# Patient Record
Sex: Female | Born: 1945 | Race: White | Hispanic: No | Marital: Married | Smoking: Current every day smoker
Health system: Southern US, Community
[De-identification: ages and names within clinical notes are randomized; demographics above are authoritative.]

## PROBLEM LIST (undated history)

## (undated) DIAGNOSIS — Z9109 Other allergy status, other than to drugs and biological substances: Secondary | ICD-10-CM

## (undated) HISTORY — PX: RETINAL DETACHMENT SURGERY: SHX105

## (undated) HISTORY — PX: OVARY SURGERY: SHX727

---

## 2004-03-23 ENCOUNTER — Ambulatory Visit: Payer: Self-pay | Admitting: Internal Medicine

## 2004-05-11 ENCOUNTER — Ambulatory Visit: Payer: Self-pay | Admitting: Internal Medicine

## 2006-08-04 ENCOUNTER — Ambulatory Visit: Payer: Self-pay | Admitting: Internal Medicine

## 2006-09-06 ENCOUNTER — Ambulatory Visit: Payer: Self-pay | Admitting: Urology

## 2006-10-28 ENCOUNTER — Ambulatory Visit: Payer: Self-pay | Admitting: Unknown Physician Specialty

## 2007-07-10 ENCOUNTER — Ambulatory Visit: Payer: Self-pay | Admitting: Internal Medicine

## 2007-07-11 ENCOUNTER — Ambulatory Visit: Payer: Self-pay | Admitting: Otolaryngology

## 2008-01-30 ENCOUNTER — Ambulatory Visit: Payer: Self-pay | Admitting: Ophthalmology

## 2008-03-19 ENCOUNTER — Ambulatory Visit: Payer: Self-pay | Admitting: Ophthalmology

## 2008-06-24 ENCOUNTER — Ambulatory Visit: Payer: Self-pay | Admitting: Internal Medicine

## 2008-06-27 ENCOUNTER — Ambulatory Visit: Payer: Self-pay | Admitting: Internal Medicine

## 2008-08-13 ENCOUNTER — Ambulatory Visit: Payer: Self-pay | Admitting: Unknown Physician Specialty

## 2009-10-21 ENCOUNTER — Ambulatory Visit: Payer: Self-pay | Admitting: Ophthalmology

## 2009-10-22 ENCOUNTER — Ambulatory Visit: Payer: Self-pay | Admitting: Ophthalmology

## 2010-10-02 ENCOUNTER — Ambulatory Visit: Payer: Self-pay | Admitting: Internal Medicine

## 2011-08-09 ENCOUNTER — Ambulatory Visit: Payer: Self-pay | Admitting: Sports Medicine

## 2011-10-27 ENCOUNTER — Ambulatory Visit: Payer: Self-pay | Admitting: Obstetrics and Gynecology

## 2011-10-27 DIAGNOSIS — Z01810 Encounter for preprocedural cardiovascular examination: Secondary | ICD-10-CM

## 2011-10-27 LAB — URINALYSIS, COMPLETE
Bacteria: NONE SEEN
Bilirubin,UR: NEGATIVE
Ketone: NEGATIVE
Leukocyte Esterase: NEGATIVE
RBC,UR: 4 /HPF (ref 0–5)
Specific Gravity: 1.006 (ref 1.003–1.030)
Squamous Epithelial: <1
WBC UR: NONE SEEN /HPF (ref 0–5)

## 2011-10-27 LAB — CBC
HGB: 14.7 g/dL (ref 12.0–16.0)
MCH: 32.4 pg (ref 26.0–34.0)
Platelet: 250 10*3/uL (ref 150–440)
RBC: 4.53 10*6/uL (ref 3.80–5.20)
RDW: 13.2 % (ref 11.5–14.5)
WBC: 11.8 10*3/uL — ABNORMAL HIGH (ref 3.6–11.0)

## 2011-11-02 ENCOUNTER — Ambulatory Visit: Payer: Self-pay | Admitting: Obstetrics and Gynecology

## 2011-11-05 LAB — PATHOLOGY REPORT

## 2011-11-26 ENCOUNTER — Ambulatory Visit: Payer: Self-pay | Admitting: Internal Medicine

## 2011-12-14 ENCOUNTER — Ambulatory Visit: Payer: Self-pay | Admitting: Internal Medicine

## 2013-07-05 ENCOUNTER — Ambulatory Visit: Payer: Self-pay | Admitting: Unknown Physician Specialty

## 2013-07-09 LAB — PATHOLOGY REPORT

## 2013-07-12 ENCOUNTER — Ambulatory Visit: Payer: Self-pay | Admitting: Unknown Physician Specialty

## 2014-05-07 NOTE — Op Note (Signed)
PATIENT NAME:  Hayley Burnett, Hayley Burnett MR#:  [id number] DATE OF BIRTH:  Aug 06, 1945  DATE OF PROCEDURE:  11/02/2011  PREOPERATIVE DIAGNOSIS: Right ovarian cyst.   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   PROCEDURES:  1. Operative laparoscopy. 2. Bilateral salpingo-oophorectomy.   ANESTHESIA: General.   SURGEON: Santonio Speakman A. Patton SallesWeaver-Lee, MD   ASSISTANT: Dr. Weston SettleEryn Clipp    ESTIMATED BLOOD LOSS: Minimal.   COMPLICATIONS: None.   FINDINGS: Simple-appearing cyst on right ovary containing clear fluid.   SPECIMEN: Right tube and ovary sent for frozen section; left tube and ovary and portion of right tube sent in formalin.   INDICATIONS: The patient is a 69 year old who presents for ultrasound follow-up for findings of a cyst on right ovary. Second ultrasound continued to show the cyst. CA-125 was within normal range. The patient desired surgical evaluation. Risks, benefits, indications, and alternatives of the procedure were explained and informed consent was obtained.   PROCEDURE: The patient was taken to the operating room with IV fluids running. She was prepped and draped in the usual sterile fashion in [place] stirrups. A speculum was placed inside the vagina. The anterior lip of the cervix was grasped with a single-tooth tenaculum and a Hulka tenaculum was placed for uterine manipulation. Attention was turned to the patient's abdomen where the infraumbilical region was injected with 0.5% Sensorcaine. A 5 mm incision was placed and the Veress needle was placed. The abdomen was insufflated with CO2 gas. The Veress needle was removed and the abdomen was entered under direct visualization using a 5 mm Xcel trocar. Two additional 11 mm Xcel trocars were placed in the patient's abdomen laterally. Findings were as previously stated. The uterus was elevated and the right tube and ovary were grasped. The infundibulopelvic ligament on the right was  cauterized with the Kleppinger and transected using the harmonic scalpel. The specimen was removed with a catch bag and sent for frozen section. Attention was turned to the left tube and ovary where it was grasped, however, due to the surgical area being obscured by bowel, a fourth 5 mm suprapubic port had to be placed. The bowel was retracted back. The left tube and ovary were grasped and the infundibulopelvic ligament was cauterized with Kleppinger and the left tube and ovary were transected using the Harmonic scalpel. The remainder of the left and right tube portions were removed with the Harmonic up to the level of the uterine cornu and sent to pathology in formalin. The patient tolerated the procedure well. All surgical areas were made hemostatic. Arista was placed in the left surgical area for further hemostasis. All instrumentation was removed from the patient's abdomen after the fascial closure device was used to close both 11 mm incision fascial sites. The skin of the 11 mm incisions were closed with 4-0 Vicryl and the skin of the 5 mm incisions were closed with Dermabond. The patient tolerated the procedure well. Sponge, needle, and instrument counts were correct x2. The patient was awakened from anesthesia and taken to the recovery room in stable condition.   ____________________________ Sonda PrimesLashawn A. Patton SallesWeaver-Lee, MD law:drc Burnett: 11/02/2011 12:12:18 ET T: 11/02/2011 12:34:47 ET JOB#: [id number]  cc: Flint MelterLashawn A. Patton SallesWeaver-Lee, MD, <Dictator> Janyth ContesLASHAWN A WEAVER LEE MD ELECTRONICALLY SIGNED 11/10/2011 7:46

## 2014-10-30 ENCOUNTER — Emergency Department
Admission: EM | Admit: 2014-10-30 | Discharge: 2014-10-30 | Disposition: A | Discharge status: Home / Self Care | Payer: Medicare Other | Attending: Emergency Medicine | Admitting: Emergency Medicine

## 2014-10-30 ENCOUNTER — Emergency Department: Payer: Medicare Other

## 2014-10-30 DIAGNOSIS — Z720 Tobacco use: Secondary | ICD-10-CM | POA: Diagnosis not present

## 2014-10-30 DIAGNOSIS — Z880 Allergy status to penicillin: Secondary | ICD-10-CM | POA: Insufficient documentation

## 2014-10-30 DIAGNOSIS — R0602 Shortness of breath: Secondary | ICD-10-CM | POA: Diagnosis present

## 2014-10-30 DIAGNOSIS — Z79899 Other long term (current) drug therapy: Secondary | ICD-10-CM | POA: Diagnosis not present

## 2014-10-30 DIAGNOSIS — J441 Chronic obstructive pulmonary disease with (acute) exacerbation: Secondary | ICD-10-CM | POA: Diagnosis not present

## 2014-10-30 HISTORY — DX: Other allergy status, other than to drugs and biological substances: Z91.09

## 2014-10-30 MED ORDER — IPRATROPIUM-ALBUTEROL 0.5-2.5 (3) MG/3ML IN SOLN
9.0000 mL | Freq: Once | RESPIRATORY_TRACT | Status: AC
  Administered 2014-10-30: 9 mL via RESPIRATORY_TRACT
  Filled 2014-10-30: qty 9

## 2014-10-30 MED ORDER — PREDNISONE 20 MG PO TABS: 40.0000 mg | ORAL_TABLET | Freq: Every day | ORAL | Status: AC

## 2014-10-30 MED ORDER — AZITHROMYCIN 250 MG PO TABS: ORAL_TABLET | ORAL | Status: AC

## 2014-10-30 MED ORDER — PREDNISONE 20 MG PO TABS
60.0000 mg | ORAL_TABLET | Freq: Once | ORAL | Status: AC
  Administered 2014-10-30: 60 mg via ORAL
  Filled 2014-10-30: qty 3

## 2014-10-30 NOTE — ED Notes (Signed)
Pt presents with SOB. Pt took inhaler without relief.

## 2014-10-30 NOTE — Discharge Instructions (Signed)

## 2014-10-30 NOTE — ED Notes (Addendum)
Pt advised to take antibiotic. If side effects occur, pt was given number (Liz's number) to call to get antibiotic changed.

## 2014-10-30 NOTE — ED Provider Notes (Signed)
[facility] Emergency Department Provider Note  ____________________________________________  Time seen: 8:15 AM  I have reviewed the triage vital signs and the nursing notes.   HISTORY  Chief Complaint Shortness of Breath    HPI Hayley Burnett is a 69 y.o. female with COPD who reports an exacerbation for the last 48 hours. She states she recently traveled to [place] where she was having a lot of allergies including nasal congestion and running and a nonproductive cough and developed progressively worsening wheezing. She is been using her albuterol inhaler with some relief but the symptoms recur within a few hours. No fever, no chest pain no dizziness or syncope.  No recent trauma hospitalizations or surgeries. No leg swelling or pain.   Past Medical History  Diagnosis Date  . Environmental allergies    COPD  There are no active problems to display for this patient.    Past Surgical History  Procedure Laterality Date  . Ovary surgery    . Retinal detachment surgery     Tonsillectomy Bilateral salpingo-oophorectomy  Current Outpatient Rx  Name  Route  Sig  Dispense  Refill  . albuterol (PROAIR HFA) 108 (90 BASE) MCG/ACT inhaler   Inhalation   Inhale 2 puffs into the lungs every 6 (six) hours as needed for wheezing or shortness of breath.          . fexofenadine (ALLEGRA) 180 MG tablet   Oral   Take 90 mg by mouth daily.         . fluticasone (FLONASE) 50 MCG/ACT nasal spray   Each Nare   Place 2 sprays into both nostrils daily.         . Omeprazole 20 MG TBEC   Oral   Take 20 mg by mouth at bedtime.         Marland Kitchen. azithromycin (ZITHROMAX Z-PAK) 250 MG tablet      Take 2 tablets (500 mg) on  Day 1,  followed by 1 tablet (250 mg) once daily on Days 2 through 5.   6 each   0   . predniSONE (DELTASONE) 20 MG tablet   Oral   Take 2 tablets (40 mg total) by mouth daily.   8 tablet   0      Allergies Sulfa antibiotics;  Amoxicillin-pot clavulanate; Cephalexin; Ciprofloxacin; Doxycycline; Neggram ; and Pseudoephedrine hcl No known drug allergies  No family history on file.  Social History Social History  Substance Use Topics  . Smoking status: Current Every Day Smoker -- 0.50 packs/day    Types: Cigarettes  . Smokeless tobacco: None  . Alcohol Use: No   smokes a half pack per day, no alcohol or drug use  Review of Systems  Constitutional:   No fever or chills. No weight changes Eyes:   No blurry vision or double vision.  ENT:   No sore throat. Cardiovascular:   No chest pain. Respiratory:   Positive dyspnea and nonproductive cough Gastrointestinal:   Negative for abdominal pain, vomiting and diarrhea.  No BRBPR or melena. Genitourinary:   Negative for dysuria, urinary retention, bloody urine, or difficulty urinating. Musculoskeletal:   Negative for back pain. No joint swelling or pain. Skin:   Negative for rash. Neurological:   Negative for headaches, focal weakness or numbness. Psychiatric:  No anxiety or depression.   Endocrine:  No hot/cold intolerance, changes in energy, or sleep difficulty.  10-point ROS otherwise negative.  ____________________________________________   PHYSICAL EXAM:  VITAL SIGNS: ED Triage  Vitals  Enc Vitals Group     BP 10/30/14 0814 174/72 mmHg     Pulse Rate 10/30/14 0814 89     Resp 10/30/14 0814 16     Temp --      Temp src --      SpO2 10/30/14 0814 96 %     Weight --      Height --      Head Cir --      Peak Flow --      Pain Score --      Pain Loc --      Pain Edu? --      Excl. in GC? --      Constitutional:   Alert and oriented. Well appearing and in no distress. Eyes:   No scleral icterus. No conjunctival pallor. PERRL. EOMI ENT   Head:   Normocephalic and atraumatic.   Nose:   Positive rhinorrhea. No septal hematoma   Mouth/Throat:   MMM, no pharyngeal erythema. No peritonsillar mass. No uvula shift.   Neck:   No stridor.  No SubQ emphysema. No meningismus. Hematological/Lymphatic/Immunilogical:   No cervical lymphadenopathy. Cardiovascular:   RRR. Normal and symmetric distal pulses are present in all extremities. No murmurs, rubs, or gallops. Respiratory:  Diffuse wheezing, good air entry in all lung fields, no tachypnea nor retractions, prolonged expiratory phase. Gastrointestinal:   Soft and nontender. No distention. There is no CVA tenderness.  No rebound, rigidity, or guarding. Genitourinary:   deferred Musculoskeletal:   Nontender with normal range of motion in all extremities. No joint effusions.  No lower extremity tenderness.  No edema. Neurologic:   Normal speech and language.  CN 2-10 normal. Motor grossly intact. No pronator drift.  Normal gait. No gross focal neurologic deficits are appreciated.  Skin:    Skin is warm, dry and intact. No rash noted.  No petechiae, purpura, or bullae. Psychiatric:   Mood and affect are normal. Speech and behavior are normal. Patient exhibits appropriate insight and judgment.  ____________________________________________    LABS (pertinent positives/negatives) (all labs ordered are listed, but only abnormal results are displayed) Labs Reviewed - No data to display ____________________________________________   EKG  Interpreted by me Normal sinus rhythm rate of 83, normal axis intervals QRS and ST segments and T waves  ____________________________________________    RADIOLOGY  Chest x-ray has a bronchitis no focal pneumonia  ____________________________________________   PROCEDURES   ____________________________________________   INITIAL IMPRESSION / ASSESSMENT AND PLAN / ED COURSE  Pertinent labs & imaging results that were available during my care of the patient were reviewed by me and considered in my medical decision making (see chart for details).  Patient presents with COPD exacerbation. Low suspicion for CHF DVT PE ACS or carditis.  Likely precipitated to exposure to new seasonal allergens that she is not accustomed to versus oncoming upper respiratory infection with the rhinorrhea. Steroids and DuoNeb's, check chest x-ray. Vital signs are reassuring, not hypoxic, likely candidate for outpatient therapy if symptoms are improved with treatment in the emergency department.  ----------------------------------------- 10:28 AM on 10/30/2014 -----------------------------------------  Vital signs stable, feels much much better, lung exam shows slight diffuse expiratory wheezing but normalization of expiratory phase, no accessory muscle use, normal work of breathing. We'll keep patient on steroids and started Z-Pak and have her follow up with primary care   ____________________________________________   FINAL CLINICAL IMPRESSION(S) / ED DIAGNOSES  Final diagnoses:  COPD exacerbation (HCC)  Sharman Cheek, MD 10/30/14 1029

## 2014-10-30 NOTE — ED Notes (Signed)
Pt c/o of SOB x3 days. Pt took albuterol inhaler at 3:30am and again at 6:30 am without relief. Pt states this is allergies from traveling through [place] and [place] the past week. Noticed expiratory wheezing on assessment. Vitals WDL. Pt resting in bed.

## 2016-06-28 IMAGING — CR DG CHEST 2V
2 series · 2 of 2 positions shown · non-contrast
Comparison: None.

CLINICAL DATA: Dyspnea for 1 week, increasing over 2 days. Cough.
Tobacco use.

EXAM:
CHEST  2 VIEW

[chest pa]
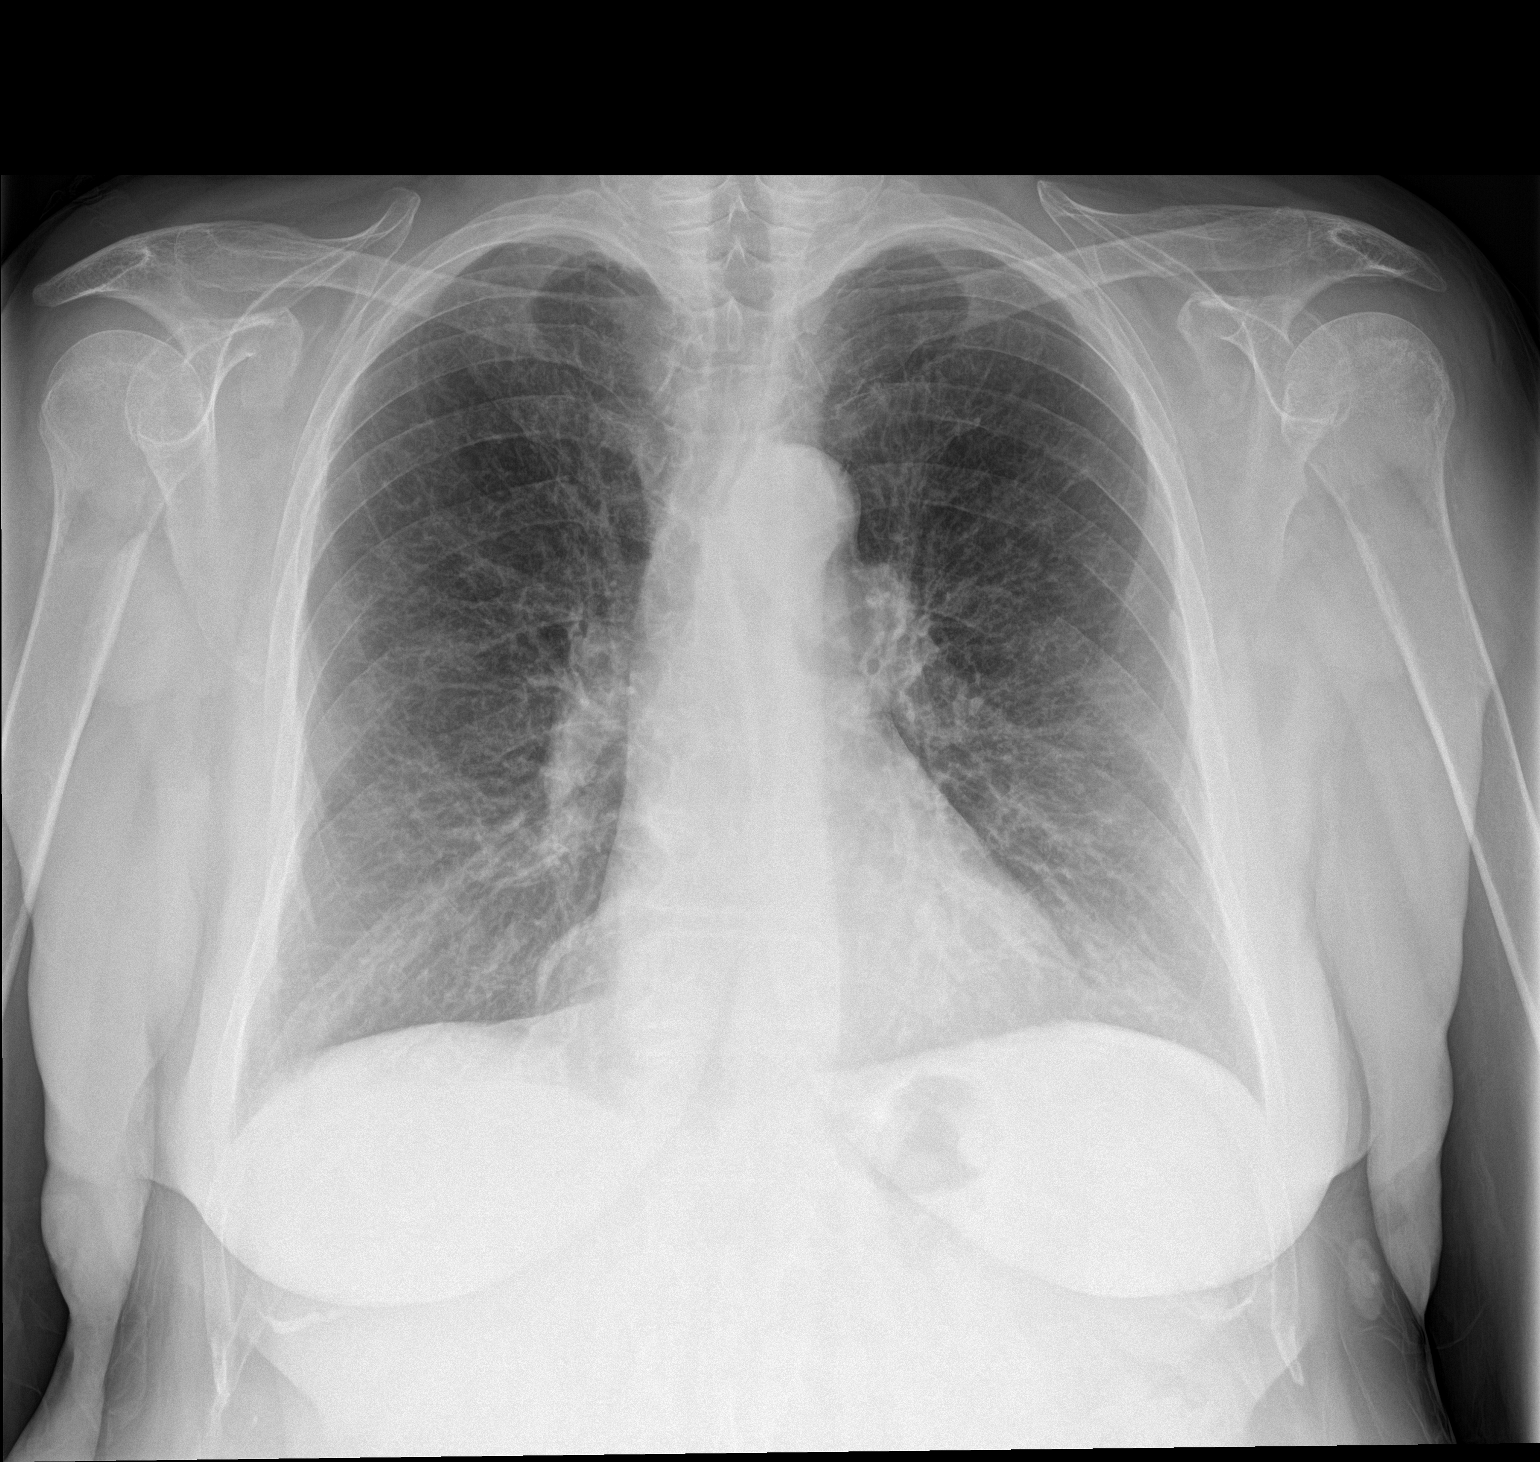

[chest lat]
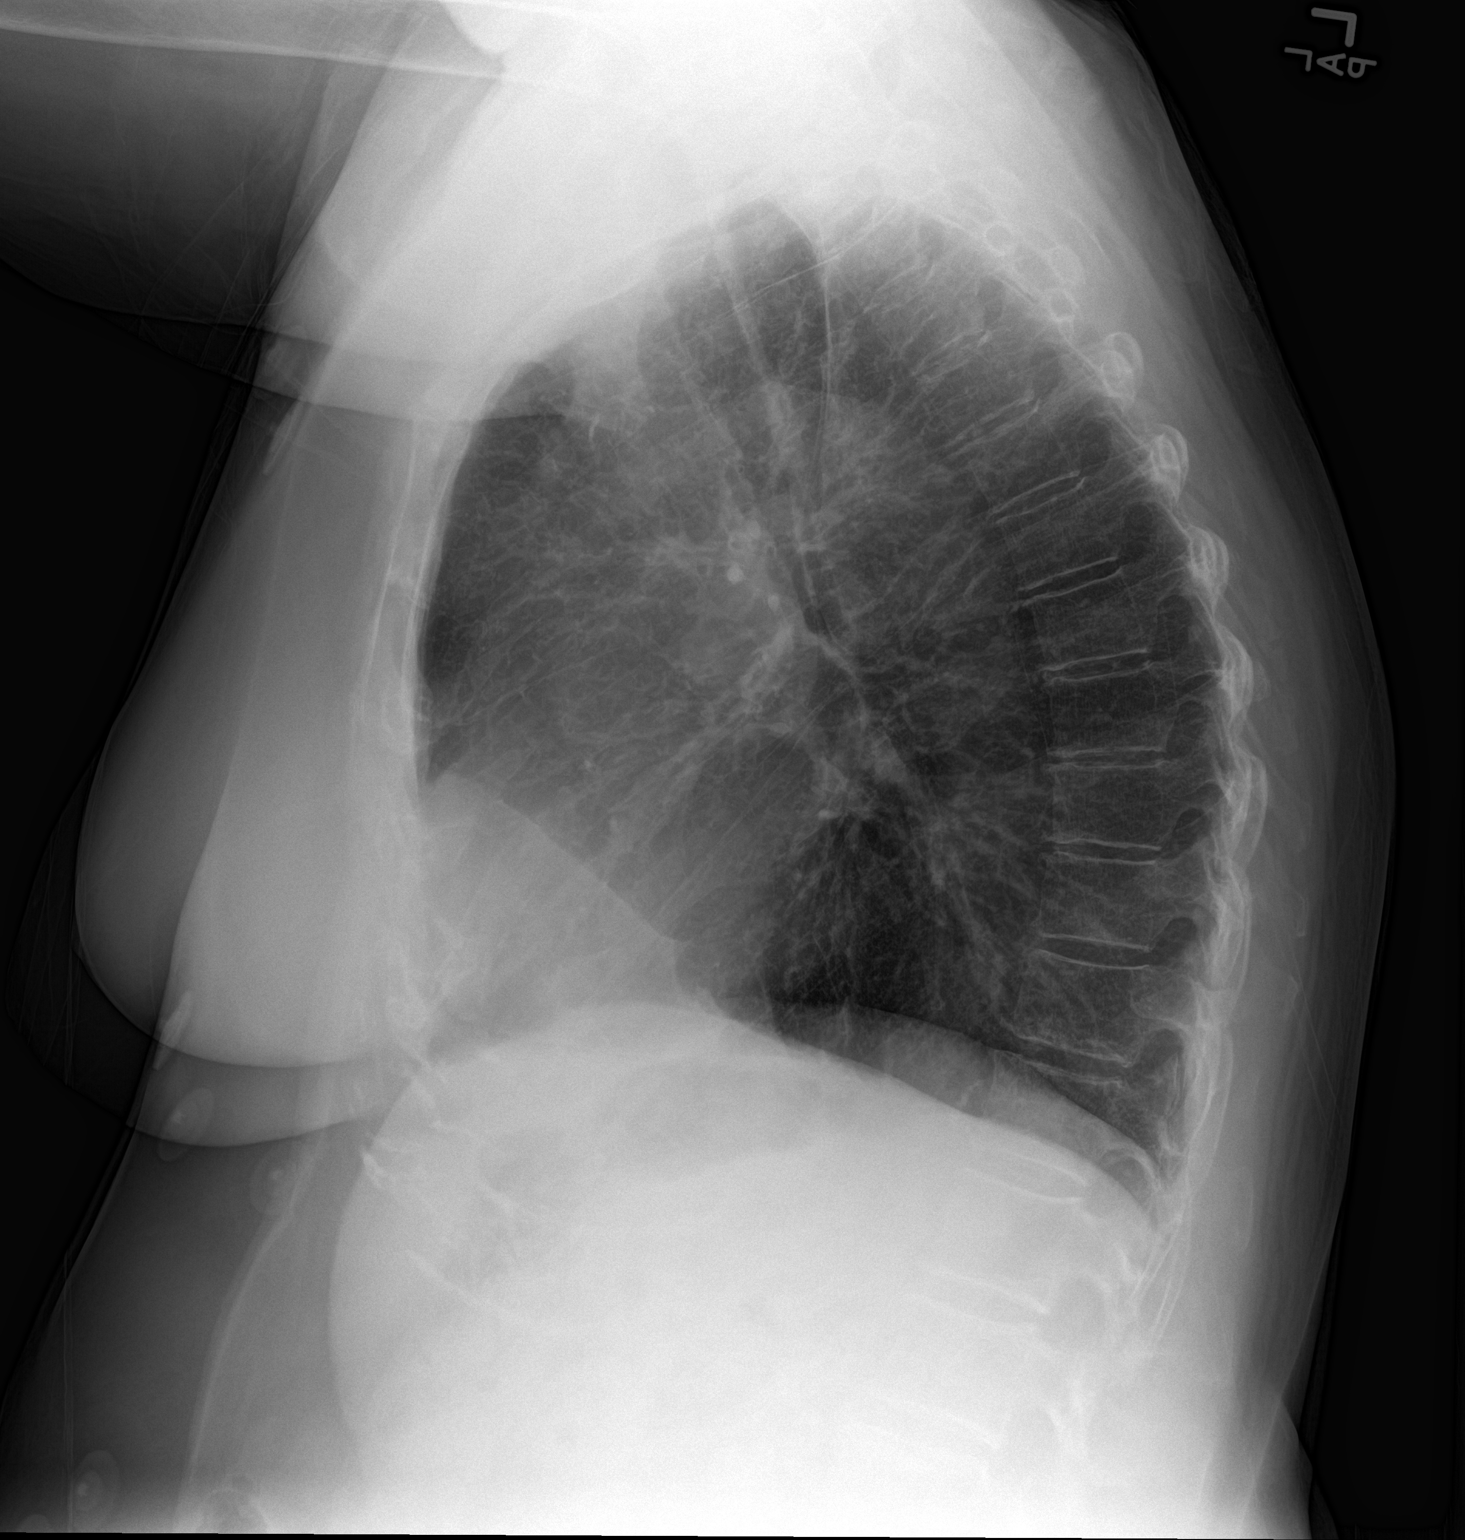

[2 of 2 positions shown; findings below may reference images not displayed]

FINDINGS: Airway thickening is present, suggesting bronchitis or reactive
airways disease. Mild interstitial accentuation, as can commonly be
encountered in smokers.

Lungs appear otherwise clear. Borderline prominence of the main
pulmonary artery/left hilum.
IMPRESSION: 1. Airway thickening is present, suggesting bronchitis or reactive
airways disease.
2. Mild interstitial accentuation, as can commonly be encountered in
smokers.
3. Borderline prominence the left hilum.

## 2017-04-26 ENCOUNTER — Ambulatory Visit: Payer: Medicare Other | Attending: Internal Medicine

## 2017-04-26 DIAGNOSIS — G4733 Obstructive sleep apnea (adult) (pediatric): Secondary | ICD-10-CM | POA: Diagnosis not present
